# Patient Record
Sex: Male | Born: 1950 | Race: Black or African American | Hispanic: No | Marital: Married
Health system: Southern US, Community
[De-identification: ages and names within clinical notes are randomized; demographics above are authoritative.]

## PROBLEM LIST (undated history)

## (undated) DIAGNOSIS — I10 Essential (primary) hypertension: Secondary | ICD-10-CM

---

## 2006-12-03 ENCOUNTER — Emergency Department (HOSPITAL_COMMUNITY): Admission: EM | Admit: 2006-12-03 | Discharge: 2006-12-03 | Payer: Self-pay | Admitting: Emergency Medicine

## 2007-01-31 ENCOUNTER — Ambulatory Visit: Payer: Self-pay
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
## 2019-06-18 ENCOUNTER — Emergency Department
Admission: EM | Admit: 2019-06-18 | Discharge: 2019-06-18 | Disposition: A | Discharge status: Home / Self Care | Payer: No Typology Code available for payment source | Attending: Student | Admitting: Student

## 2019-06-18 ENCOUNTER — Encounter: Payer: Self-pay | Admitting: Emergency Medicine

## 2019-06-18 ENCOUNTER — Other Ambulatory Visit: Payer: Self-pay

## 2019-06-18 DIAGNOSIS — I10 Essential (primary) hypertension: Secondary | ICD-10-CM | POA: Diagnosis not present

## 2019-06-18 DIAGNOSIS — H43392 Other vitreous opacities, left eye: Secondary | ICD-10-CM | POA: Diagnosis not present

## 2019-06-18 DIAGNOSIS — H5712 Ocular pain, left eye: Secondary | ICD-10-CM | POA: Diagnosis present

## 2019-06-18 HISTORY — DX: Essential (primary) hypertension: I10

## 2019-06-18 MED ORDER — FLUORESCEIN SODIUM 1 MG OP STRP
1.0000 | ORAL_STRIP | Freq: Once | OPHTHALMIC | Status: AC
  Administered 2019-06-18: 12:00:00 1 via OPHTHALMIC
  Filled 2019-06-18: qty 1

## 2019-06-18 MED ORDER — TETRACAINE HCL 0.5 % OP SOLN
2.0000 [drp] | Freq: Once | OPHTHALMIC | Status: AC
  Administered 2019-06-18: 12:00:00 2 [drp] via OPHTHALMIC
  Filled 2019-06-18: qty 4

## 2019-06-18 MED ORDER — EYE WASH OPHTH SOLN
1.0000 [drp] | OPHTHALMIC | Status: DC | PRN
Start: 2019-06-18 — End: 2019-06-18
  Administered 2019-06-18: 12:00:00 1 [drp] via OPHTHALMIC
  Filled 2019-06-18: qty 118

## 2019-06-18 NOTE — ED Notes (Signed)
Visual acuity preformed. Results are as follows. L eye 20/25, R eye 20/40 and bilaterally 20/25.

## 2019-06-18 NOTE — ED Triage Notes (Signed)
Pt in via POV, reports left eye pain since Friday, also reports seeing "floaters."  Ambulatory to triage, NAD noted at this time.
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
## 2019-06-18 NOTE — ED Notes (Signed)
See triage note  Presents with pain to left eye since Friday   States he noticed some floaters on Friday   States he has seen "flashes" to same eye and also describes "seeing tree branches"  Denies any trauma to eye
